# Patient Record
Sex: Male | Born: 1985 | Race: White | Hispanic: No | Marital: Married | Smoking: Never smoker
Health system: Southern US, Community
[De-identification: ages and names within clinical notes are randomized; demographics above are authoritative.]

---

## 2015-12-16 ENCOUNTER — Encounter (HOSPITAL_COMMUNITY): Payer: Self-pay

## 2015-12-16 ENCOUNTER — Emergency Department (HOSPITAL_COMMUNITY)
Admission: EM | Admit: 2015-12-16 | Discharge: 2015-12-16 | Disposition: A | Discharge status: Home / Self Care | Payer: BC Managed Care – PPO | Attending: Emergency Medicine | Admitting: Emergency Medicine

## 2015-12-16 DIAGNOSIS — S0993XA Unspecified injury of face, initial encounter: Secondary | ICD-10-CM | POA: Diagnosis present

## 2015-12-16 DIAGNOSIS — Y999 Unspecified external cause status: Secondary | ICD-10-CM | POA: Insufficient documentation

## 2015-12-16 DIAGNOSIS — S01511A Laceration without foreign body of lip, initial encounter: Secondary | ICD-10-CM | POA: Diagnosis not present

## 2015-12-16 DIAGNOSIS — Z23 Encounter for immunization: Secondary | ICD-10-CM | POA: Insufficient documentation

## 2015-12-16 DIAGNOSIS — Y929 Unspecified place or not applicable: Secondary | ICD-10-CM | POA: Diagnosis not present

## 2015-12-16 DIAGNOSIS — W228XXA Striking against or struck by other objects, initial encounter: Secondary | ICD-10-CM | POA: Diagnosis not present

## 2015-12-16 DIAGNOSIS — Y939 Activity, unspecified: Secondary | ICD-10-CM | POA: Insufficient documentation

## 2015-12-16 MED ORDER — BUPIVACAINE HCL 0.25 % IJ SOLN
5.0000 mL | Freq: Once | INTRAMUSCULAR | Status: AC
  Administered 2015-12-16: 5 mL
  Filled 2015-12-16: qty 5

## 2015-12-16 MED ORDER — TETANUS-DIPHTH-ACELL PERTUSSIS 5-2.5-18.5 LF-MCG/0.5 IM SUSP
0.5000 mL | Freq: Once | INTRAMUSCULAR | Status: AC
  Administered 2015-12-16: 0.5 mL via INTRAMUSCULAR
  Filled 2015-12-16: qty 0.5
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
## 2015-12-16 NOTE — Discharge Instructions (Signed)
Watch for any signs of infection. Apply ice pack for swelling. Bacitracin twice a day. Follow up as needed. Sutures should dissolve on their own in about 7-10 days, if are not resolved, wiped with warm water wash cloth.

## 2016-01-04 ENCOUNTER — Emergency Department (HOSPITAL_COMMUNITY)
Admission: EM | Admit: 2016-01-04 | Discharge: 2016-01-05 | Disposition: A | Discharge status: Home / Self Care | Payer: BC Managed Care – PPO | Attending: Emergency Medicine | Admitting: Emergency Medicine

## 2016-01-04 ENCOUNTER — Encounter (HOSPITAL_COMMUNITY): Payer: Self-pay | Admitting: Emergency Medicine

## 2016-01-04 DIAGNOSIS — H9313 Tinnitus, bilateral: Secondary | ICD-10-CM | POA: Insufficient documentation

## 2016-01-04 DIAGNOSIS — R030 Elevated blood-pressure reading, without diagnosis of hypertension: Secondary | ICD-10-CM | POA: Diagnosis not present

## 2016-01-04 DIAGNOSIS — IMO0001 Reserved for inherently not codable concepts without codable children: Secondary | ICD-10-CM

## 2016-01-04 DIAGNOSIS — Z79899 Other long term (current) drug therapy: Secondary | ICD-10-CM | POA: Insufficient documentation

## 2016-01-04 DIAGNOSIS — R42 Dizziness and giddiness: Secondary | ICD-10-CM | POA: Diagnosis present

## 2016-01-04 NOTE — ED Triage Notes (Signed)
Pt states he was watching tv, got up to go to the bathroom and had "ringing" in his ears.  Didn't feel like he could speak, confused, felt dizzy.  On the way here in the ambulance he states he his vision in his left eye was "blurry with floaters".  History of panic attacks but does not feel that this episode is the same as those he's previously had.

## 2016-01-04 NOTE — ED Triage Notes (Signed)
Pt transported from home by EMS with c/o dizziness and lightheadedness while ambulating to BR. 200/134 initial manual pressure.  A & O

## 2016-01-05 LAB — I-STAT CHEM 8, ED
BUN: 19 mg/dL (ref 6–20)
CALCIUM ION: 1.16 mmol/L (ref 1.15–1.40)
CHLORIDE: 106 mmol/L (ref 101–111)
Creatinine, Ser: 1.1 mg/dL (ref 0.61–1.24)
GLUCOSE: 118 mg/dL — AB (ref 65–99)
HCT: 44 % (ref 39.0–52.0)
Hemoglobin: 15 g/dL (ref 13.0–17.0)
Potassium: 4.6 mmol/L (ref 3.5–5.1)
Sodium: 141 mmol/L (ref 135–145)
TCO2: 25 mmol/L (ref 0–100)

## 2016-01-05 LAB — I-STAT TROPONIN, ED: Troponin i, poc: 0 ng/mL (ref 0.00–0.08)

## 2016-01-05 NOTE — ED Provider Notes (Signed)
[facility]-EMERGENCY DEPT Provider Note   CSN: [id number] Arrival date & time: 01/04/16  2159  By signing my name below, I, Cody Roberson, attest that this documentation has been prepared under the direction and in the presence of  [employer], [place]. Electronically Signed: Christy Roberson, ED Scribe. 01/05/16. 12:55 AM.  History   Chief Complaint Chief Complaint  Patient presents with  . Hypertension   The history is provided by the patient and medical records. No language interpreter was used.   HPI Comments:  Cody Roberson is a 30 y.o. male brought in by ambulance, who presents to the Emergency Department complaining of gradually worsening dizziness and lightheadedness beginning this afternoon. Pt was watching TV and got up to go to the bathroom when he began experiencing tinnitis and felt like his body "gave out." For 15 minutes in the ambulance and a few minutes in the ED, pt began having light sensitivity and seeing flashing lights out of the lower left corner of the left eye. He has a history of anxiety and notes this feels different from his typical anxiety attacks. He denies history of diabetes and taking medications for HTN. Pt notes that he an EKG done over the summer and had sinus tachycardia; as a result, had an entire cardiac panel done which was normal. He denies fevers, recent illness or URI, LOC, chest tightness, hyperventilating, nausea and vomitting.    There are no active problems to display for this patient.   History reviewed. No pertinent surgical history.    Home Medications    Prior to Admission medications   Medication Sig Start Date End Date Taking? Authorizing Provider  naproxen sodium (ANAPROX) 220 MG tablet Take 660 mg by mouth 2 (two) times daily as needed. For gout flare-ups   Yes Historical Provider, MD    Family History No family history on file.  Social History Social History  Substance Use Topics  . Smoking status: Never Smoker  .  Smokeless tobacco: Never Used  . Alcohol use Yes     Comment: socially     Allergies   Review of patient's allergies indicates no known allergies.   Review of Systems Review of Systems 10 systems reviewed and all are negative for acute change except as noted in the HPI.   Physical Exam Updated Vital Signs BP 127/95   Pulse 83   Temp 97.9 F (36.6 C) (Oral)   Resp 17   Ht 5\' 9"  (1.753 m)   Wt 124.7 kg   SpO2 98%   BMI 40.61 kg/m   Physical Exam  Constitutional: He is oriented to person, place, and time. He appears well-developed and well-nourished. No distress.  Nontoxic appearing and in no distress  HENT:  Head: Normocephalic and atraumatic.  Eyes: Conjunctivae and EOM are normal. No scleral icterus.  EOMs normal without nystagmus  Neck: Normal range of motion.  No JVD  Cardiovascular: Normal rate, regular rhythm and intact distal pulses.   Pulmonary/Chest: Effort normal. No respiratory distress. He has no wheezes. He has no rales.  Respirations even and unlabored  Musculoskeletal: Normal range of motion.  Neurological: He is alert and oriented to person, place, and time. No cranial nerve deficit. He exhibits normal muscle tone. Coordination normal.  GCS 15. Speech is goal oriented. No focal neurologic deficits appreciated. Patient ambulatory with steady gait.  Skin: Skin is warm and dry. No rash noted. He is not diaphoretic. No erythema. No pallor.  Psychiatric: He has a normal mood and affect.  His behavior is normal.  Nursing note and vitals reviewed.    ED Treatments / Results   DIAGNOSTIC STUDIES:  Oxygen Saturation is 100% on RA, NML by my interpretation.    COORDINATION OF CARE:  12:55 AM  Discussed treatment plan with pt at bedside and pt agreed to plan.  2:31 AM BP in room is 120's/80's without intervention. Patient denies any symptoms recurrence. ABCD2 score is 2 for symptom duration and blood pressure. Plan for outpatient management. Patient  agreeable to plan.  Labs (all labs ordered are listed, but only abnormal results are displayed) Labs Reviewed  I-STAT CHEM 8, ED - Abnormal; Notable for the following:       Result Value   Glucose, Bld 118 (*)    All other components within normal limits  I-STAT TROPOININ, ED    EKG  EKG Interpretation  Date/Time:  Friday January 04 2016 22:06:12 EDT Ventricular Rate:  124 PR Interval:    QRS Duration: 81 QT Interval:  331 QTC Calculation: 476 R Axis:   1 Text Interpretation:  Sinus tachycardia Minimal ST elevation, inferior leads Borderline prolonged QT interval No old tracing to compare Confirmed by Queens Blvd Endoscopy LLCGLICK  MD, DAVID ([id number]) on 01/05/2016 12:19:54 AM Also confirmed by Preston FleetingGLICK  MD, DAVID ([id number]), editor Whitney PostLOGAN, Cala BradfordKIMBERLY [phone])  on 01/05/2016 12:29:00 PM       Radiology No results found.  Procedures Procedures (including critical care time)  Medications Ordered in ED Medications - No data to display   Initial Impression / Assessment and Plan / ED Course  I have reviewed the triage vital signs and the nursing notes.  Pertinent labs & imaging results that were available during my care of the patient were reviewed by me and considered in my medical decision making (see chart for details).  Clinical Course    30 year old male presents to the emergency department for evaluation of an episode of hypertension. He has no history of this and has never been on any antihypertensive medications. Symptoms resolved while in the emergency department. Blood pressure has normalized without intervention. Patient with reassuring laboratory workup and EKG. Per patient, he has had extensive workup by cardiologist including a negative stress test, Holter monitor, and echocardiogram. Low suspicion for cardiac etiology. It is possible that patient's symptoms may be secondary to TIA. He has no risk factors for stroke and a low ABCD2 score. For this reason, I believe he can be managed on an  outpatient basis. Return precautions discussed and provided. Patient discharged in satisfactory condition with no unaddressed concerns.   Final Clinical Impressions(s) / ED Diagnoses   Final diagnoses:  Elevated blood pressure  Tinnitus, bilateral    New Prescriptions Discharge Medication List as of 01/05/2016  2:31 AM      I personally performed the services described in this documentation, which was scribed in my presence. The recorded information has been reviewed and is accurate.       Antony MaduraKelly Damarea Merkel, PA-C 01/05/16 2047    Dione Boozeavid Glick, MD 01/05/16 2250

## 2016-01-05 NOTE — Discharge Instructions (Signed)
Your workup today is reassuring and your blood pressure has improved without medications. For this reason, we believe that you are stable for discharge. Follow up with your primary care doctor. We also recommend that you follow up with a neurologist. Return to the ED for any new or concerning symptoms.

## 2019-07-08 ENCOUNTER — Ambulatory Visit: Payer: BC Managed Care – PPO | Attending: Internal Medicine

## 2019-07-08 DIAGNOSIS — Z23 Encounter for immunization: Secondary | ICD-10-CM

## 2019-07-08 NOTE — Progress Notes (Signed)
   Covid-19 Vaccination Clinic  Name:  Cody Roberson    MRN: [id number] DOB: 1985/06/26  07/08/2019  Cody Roberson was observed post Covid-19 immunization for 15 minutes without incident. He was provided with Vaccine Information Sheet and instruction to access the V-Safe system.   Cody Roberson was instructed to call 911 with any severe reactions post vaccine: Marland Kitchen Difficulty breathing  . Swelling of face and throat  . A fast heartbeat  . A bad rash all over body  . Dizziness and weakness   Immunizations Administered    Name Date Dose VIS Date Route   Pfizer COVID-19 Vaccine 07/08/2019  4:29 PM 0.3 mL 04/01/2019 Intramuscular   Manufacturer: [employer], [employer]   Lot: [id number]   NDC: [address]-2

## 2019-08-03 ENCOUNTER — Ambulatory Visit: Payer: BC Managed Care – PPO | Attending: Internal Medicine

## 2019-08-03 DIAGNOSIS — Z23 Encounter for immunization: Secondary | ICD-10-CM

## 2019-08-03 NOTE — Progress Notes (Signed)
   Covid-19 Vaccination Clinic  Name:  Cody Roberson    MRN: [id number] DOB: 08/31/1985  08/03/2019  Cody Roberson was observed post Covid-19 immunization for 15 minutes without incident. He was provided with Vaccine Information Sheet and instruction to access the V-Safe system.   Cody Roberson was instructed to call 911 with any severe reactions post vaccine: Marland Kitchen Difficulty breathing  . Swelling of face and throat  . A fast heartbeat  . A bad rash all over body  . Dizziness and weakness   Immunizations Administered    Name Date Dose VIS Date Route   Pfizer COVID-19 Vaccine 08/03/2019  3:27 PM 0.3 mL 04/01/2019 Intramuscular   Manufacturer: [employer], [employer]   Lot: [id number]   NDC: [address]-7

## 2020-05-26 ENCOUNTER — Encounter (HOSPITAL_COMMUNITY): Payer: Self-pay | Admitting: Emergency Medicine

## 2020-05-26 ENCOUNTER — Other Ambulatory Visit: Payer: Self-pay

## 2020-05-26 ENCOUNTER — Emergency Department (HOSPITAL_COMMUNITY)
Admission: EM | Admit: 2020-05-26 | Discharge: 2020-05-26 | Disposition: A | Discharge status: Home / Self Care | Payer: BC Managed Care – PPO | Attending: Emergency Medicine | Admitting: Emergency Medicine

## 2020-05-26 ENCOUNTER — Emergency Department (HOSPITAL_COMMUNITY): Payer: BC Managed Care – PPO

## 2020-05-26 DIAGNOSIS — N453 Epididymo-orchitis: Secondary | ICD-10-CM | POA: Insufficient documentation

## 2020-05-26 DIAGNOSIS — N5089 Other specified disorders of the male genital organs: Secondary | ICD-10-CM

## 2020-05-26 DIAGNOSIS — N50812 Left testicular pain: Secondary | ICD-10-CM | POA: Diagnosis present

## 2020-05-26 LAB — URINALYSIS, ROUTINE W REFLEX MICROSCOPIC
Bilirubin Urine: NEGATIVE
Glucose, UA: NEGATIVE mg/dL
Hgb urine dipstick: NEGATIVE
Ketones, ur: NEGATIVE mg/dL
Leukocytes,Ua: NEGATIVE
Nitrite: NEGATIVE
Protein, ur: NEGATIVE mg/dL
Specific Gravity, Urine: 1.02 (ref 1.005–1.030)
pH: 5 (ref 5.0–8.0)

## 2020-05-26 LAB — CBC WITH DIFFERENTIAL/PLATELET
Abs Immature Granulocytes: 0.04 10*3/uL (ref 0.00–0.07)
Basophils Absolute: 0.1 10*3/uL (ref 0.0–0.1)
Basophils Relative: 1 %
Eosinophils Absolute: 0.1 10*3/uL (ref 0.0–0.5)
Eosinophils Relative: 1 %
HCT: 43.6 % (ref 39.0–52.0)
Hemoglobin: 13.8 g/dL (ref 13.0–17.0)
Immature Granulocytes: 0 %
Lymphocytes Relative: 14 %
Lymphs Abs: 1.5 10*3/uL (ref 0.7–4.0)
MCH: 28.3 pg (ref 26.0–34.0)
MCHC: 31.7 g/dL (ref 30.0–36.0)
MCV: 89.5 fL (ref 80.0–100.0)
Monocytes Absolute: 0.6 10*3/uL (ref 0.1–1.0)
Monocytes Relative: 5 %
Neutro Abs: 8.4 10*3/uL — ABNORMAL HIGH (ref 1.7–7.7)
Neutrophils Relative %: 79 %
Platelets: 403 10*3/uL — ABNORMAL HIGH (ref 150–400)
RBC: 4.87 MIL/uL (ref 4.22–5.81)
RDW: 12.6 % (ref 11.5–15.5)
WBC: 10.7 10*3/uL — ABNORMAL HIGH (ref 4.0–10.5)
nRBC: 0 % (ref 0.0–0.2)

## 2020-05-26 LAB — BASIC METABOLIC PANEL
Anion gap: 11 (ref 5–15)
BUN: 14 mg/dL (ref 6–20)
CO2: 24 mmol/L (ref 22–32)
Calcium: 9.6 mg/dL (ref 8.9–10.3)
Chloride: 107 mmol/L (ref 98–111)
Creatinine, Ser: 1.05 mg/dL (ref 0.61–1.24)
GFR, Estimated: >60 mL/min (ref >60)
Glucose, Bld: 95 mg/dL (ref 70–99)
Potassium: 4.1 mmol/L (ref 3.5–5.1)
Sodium: 142 mmol/L (ref 135–145)

## 2020-05-26 MED ORDER — LEVOFLOXACIN IN D5W 500 MG/100ML IV SOLN
500.0000 mg | Freq: Once | INTRAVENOUS | Status: DC
  Filled 2020-05-26: qty 100

## 2020-05-26 MED ORDER — NAPROXEN 500 MG PO TABS: 500.0000 mg | ORAL_TABLET | Freq: Two times a day (BID) | ORAL | 0 refills | Status: AC

## 2020-05-26 MED ORDER — LEVOFLOXACIN 500 MG PO TABS: 500.0000 mg | ORAL_TABLET | Freq: Every day | ORAL | 0 refills | Status: AC

## 2020-05-26 MED ORDER — LEVOFLOXACIN 500 MG PO TABS
500.0000 mg | ORAL_TABLET | Freq: Once | ORAL | Status: AC
  Administered 2020-05-26: 500 mg via ORAL
  Filled 2020-05-26: qty 1

## 2020-05-26 NOTE — ED Triage Notes (Addendum)
Patient reports swelling to left testicle x4 days. Reports seen at [facility] for testicle pain and given antibiotics x 1 week ago.

## 2020-05-26 NOTE — ED Provider Notes (Signed)
[facility] EMERGENCY DEPARTMENT Provider Note  CSN: [id number] Arrival date & time: 05/26/20 1729    History Chief Complaint  Patient presents with  . Groin Swelling    HPI  Cody Roberson is a 35 y.o. male with no significant PMH reports he was having some dysuria about a week ago. He was seen at [facility] and had UA done showing some protein, started on doxycycline which he has completed. GC/CT tests neg per patient. He reports the dysuria has resolved but in the last 4 days he has had marked swelling of the left testicle, associated with mild-moderate aching pain, worse when standing. No fever. No injury. No N/V/D.   History reviewed. No pertinent past medical history.  History reviewed. No pertinent surgical history.  No family history on file.  Social History   Tobacco Use  . Smoking status: Never Smoker  . Smokeless tobacco: Never Used  Substance Use Topics  . Alcohol use: Yes    Comment: socially     Home Medications Prior to Admission medications   Medication Sig Start Date End Date Taking? Authorizing Provider  naproxen sodium (ANAPROX) 220 MG tablet Take 660 mg by mouth 2 (two) times daily as needed. For gout flare-ups    [provider]     Allergies    Patient has no known allergies.   Review of Systems   Review of Systems A comprehensive review of systems was completed and negative except as noted in HPI.    Physical Exam BP (!) 159/97 (BP Location: Left Arm)   Pulse (!) 110   Temp 98 F (36.7 C) (Oral)   Resp 16   SpO2 99%   Physical Exam Vitals and nursing note reviewed.  Constitutional:      Appearance: Normal appearance.  HENT:     Head: Normocephalic and atraumatic.     Nose: Nose normal.     Mouth/Throat:     Mouth: Mucous membranes are moist.  Eyes:     Extraocular Movements: Extraocular movements intact.     Conjunctiva/sclera: Conjunctivae normal.  Cardiovascular:     Rate and Rhythm: Normal rate.   Pulmonary:     Effort: Pulmonary effort is normal.     Breath sounds: Normal breath sounds.  Abdominal:     General: Abdomen is flat.     Palpations: Abdomen is soft.     Tenderness: There is no abdominal tenderness.  Genitourinary:    Comments: No penile discharge, L testicle is markedly swollen and mildly tender to palpation, R testicle is normal. Musculoskeletal:        General: No swelling. Normal range of motion.     Cervical back: Neck supple.  Skin:    General: Skin is warm and dry.  Neurological:     General: No focal deficit present.     Mental Status: He is alert.  Psychiatric:        Mood and Affect: Mood normal.      ED Results / Procedures / Treatments   Labs (all labs ordered are listed, but only abnormal results are displayed) Labs Reviewed  CBC WITH DIFFERENTIAL/PLATELET - Abnormal; Notable for the following components:      Result Value   WBC 10.7 (*)    Platelets 403 (*)    Neutro Abs 8.4 (*)    All other components within normal limits  URINALYSIS, ROUTINE W REFLEX MICROSCOPIC  BASIC METABOLIC PANEL    EKG None  Radiology No results found.  Procedures Procedures  Medications Ordered in the ED Medications - No data to display   MDM Rules/Calculators/A&P MDM Patient with scrotal and testicular swelling, will check labs and send for [place]. Doubt torsion, likely epididymitis/orchitis.   ED Course  I have reviewed the triage vital signs and the nursing notes.  Pertinent labs & imaging results that were available during my care of the patient were reviewed by me and considered in my medical decision making (see chart for details).  Clinical Course as of 05/26/20 2045  Sat May 26, 2020  2005 CBC with mild leukocytosis.  [CS]  2021 BMP is normal.  [CS]  2024 UA neg, [place] consistent with epididymo-orchitis, will Rx Levaquin, NSAIDs, recommend scrotal elevation, Urology follow up.  [CS]    Clinical Course User Index [CS] Pollyann Savoy, MD     Final Clinical Impression(s) / ED Diagnoses Final diagnoses:  Epididymoorchitis    Rx / DC Orders ED Discharge Orders    None       Pollyann Savoy, MD 05/26/20 2045

## 2020-06-15 ENCOUNTER — Other Ambulatory Visit: Payer: Self-pay | Admitting: Urology

## 2020-06-15 ENCOUNTER — Other Ambulatory Visit (HOSPITAL_COMMUNITY): Payer: Self-pay | Admitting: Urology

## 2020-06-15 DIAGNOSIS — N453 Epididymo-orchitis: Secondary | ICD-10-CM

## 2020-06-15 DIAGNOSIS — R93812 Abnormal radiologic findings on diagnostic imaging of left testicle: Secondary | ICD-10-CM

## 2020-06-20 ENCOUNTER — Other Ambulatory Visit: Payer: Self-pay

## 2020-06-20 ENCOUNTER — Ambulatory Visit (HOSPITAL_COMMUNITY)
Admission: RE | Admit: 2020-06-20 | Discharge: 2020-06-20 | Disposition: A | Discharge status: Home / Self Care | Payer: BC Managed Care – PPO | Source: Ambulatory Visit | Attending: Urology | Admitting: Urology

## 2020-06-20 DIAGNOSIS — N453 Epididymo-orchitis: Secondary | ICD-10-CM | POA: Insufficient documentation

## 2020-06-20 DIAGNOSIS — R93812 Abnormal radiologic findings on diagnostic imaging of left testicle: Secondary | ICD-10-CM | POA: Diagnosis present

## 2020-07-02 ENCOUNTER — Other Ambulatory Visit: Payer: Self-pay | Admitting: Urology

## 2020-07-02 DIAGNOSIS — N453 Epididymo-orchitis: Secondary | ICD-10-CM

## 2020-07-02 DIAGNOSIS — N433 Hydrocele, unspecified: Secondary | ICD-10-CM

## 2020-08-13 ENCOUNTER — Other Ambulatory Visit: Payer: Self-pay

## 2020-08-13 ENCOUNTER — Ambulatory Visit
Admission: RE | Admit: 2020-08-13 | Discharge: 2020-08-13 | Disposition: A | Discharge status: Home / Self Care | Payer: BC Managed Care – PPO | Source: Ambulatory Visit | Attending: Urology | Admitting: Urology

## 2020-08-13 DIAGNOSIS — N453 Epididymo-orchitis: Secondary | ICD-10-CM | POA: Diagnosis not present

## 2020-08-13 DIAGNOSIS — N433 Hydrocele, unspecified: Secondary | ICD-10-CM | POA: Insufficient documentation

## 2021-03-18 NOTE — Addendum Note (Signed)
Encounter addended by: Novella Olive on: 03/18/2021 9:59 AM  Actions taken: Letter saved

## 2021-08-10 IMAGING — US US SCROTUM W/ DOPPLER COMPLETE
1 series · 13 of 25 positions shown · non-contrast
Comparison: 05/26/2020

CLINICAL DATA: Abnormal findings the left testicle

EXAM:
SCROTAL ULTRASOUND
DOPPLER ULTRASOUND OF THE TESTICLES
TECHNIQUE: Complete ultrasound examination of the testicles, epididymis, and
other scrotal structures was performed. Color and spectral Doppler
ultrasound were also utilized to evaluate blood flow to the
testicles.

[Series 1: us scrotum w/ doppler complete · 13 of 59 slices shown]
[im 1/59]
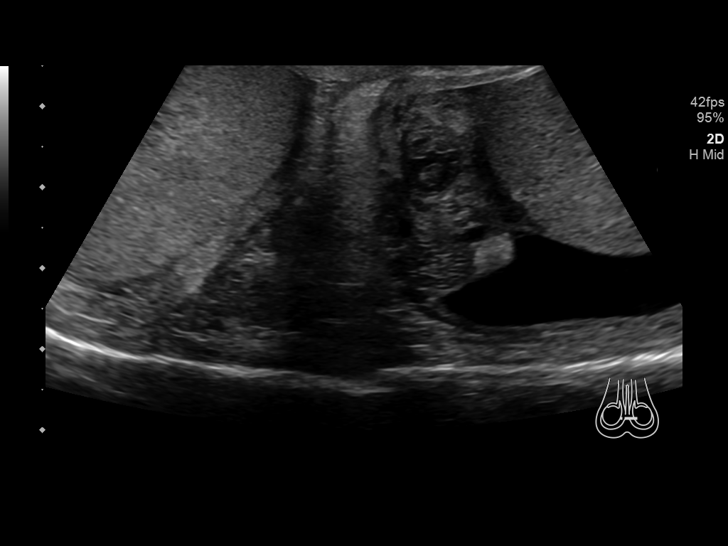
[im 5/59]
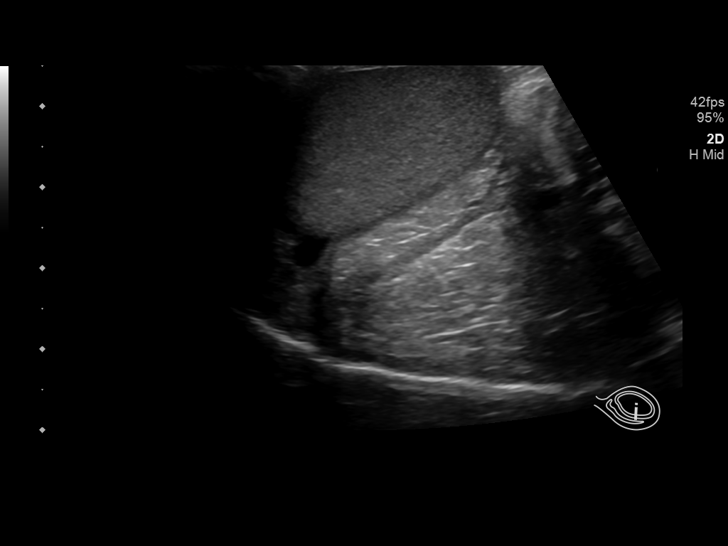
[im 10/59]
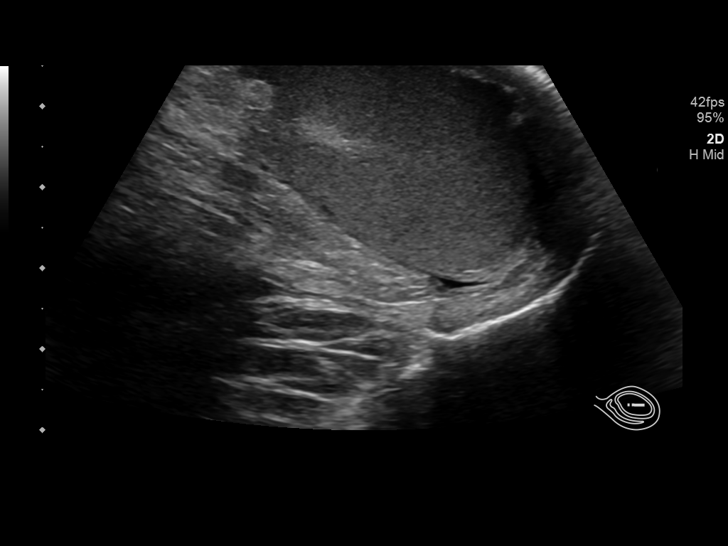
[im 15/59]
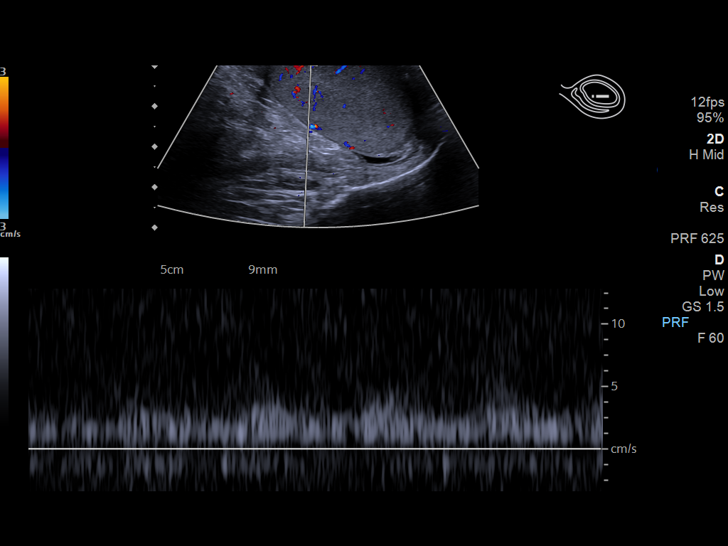
[im 20/59]
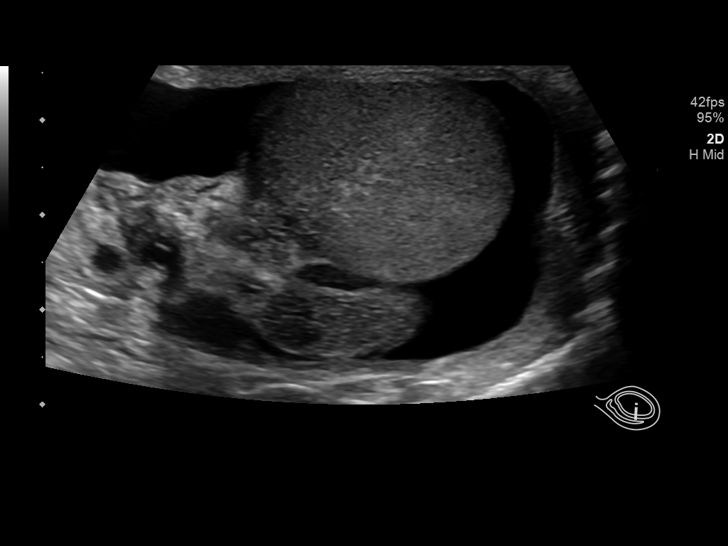
[im 25/59]
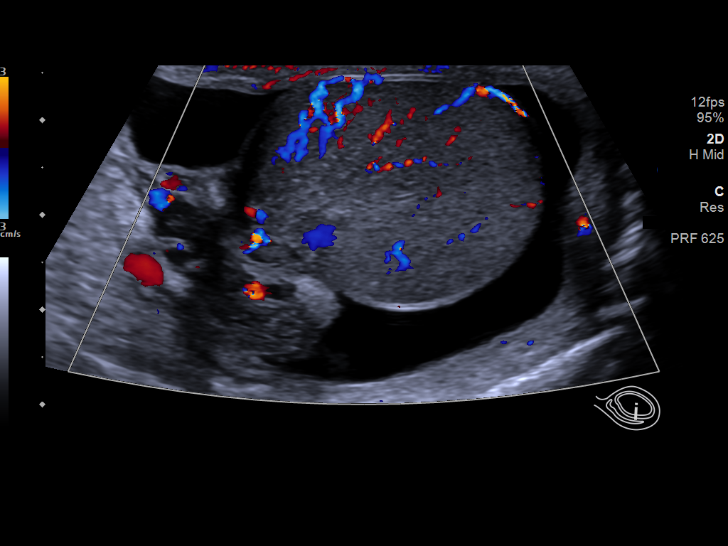
[im 30/59]
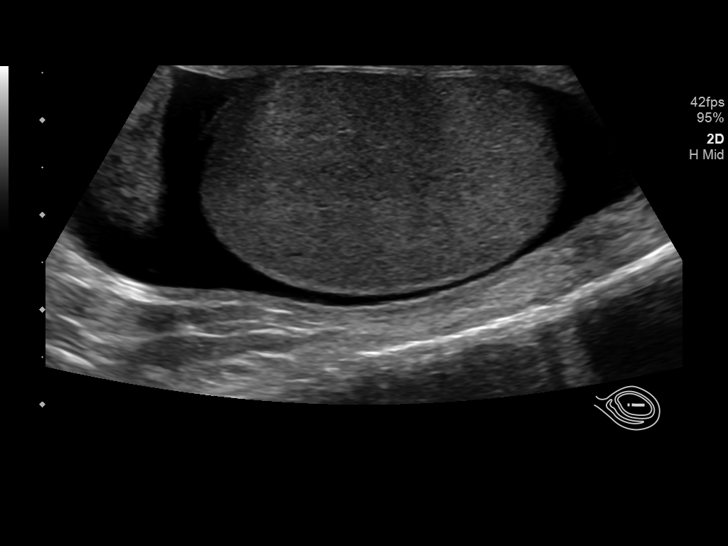
[im 34/59]
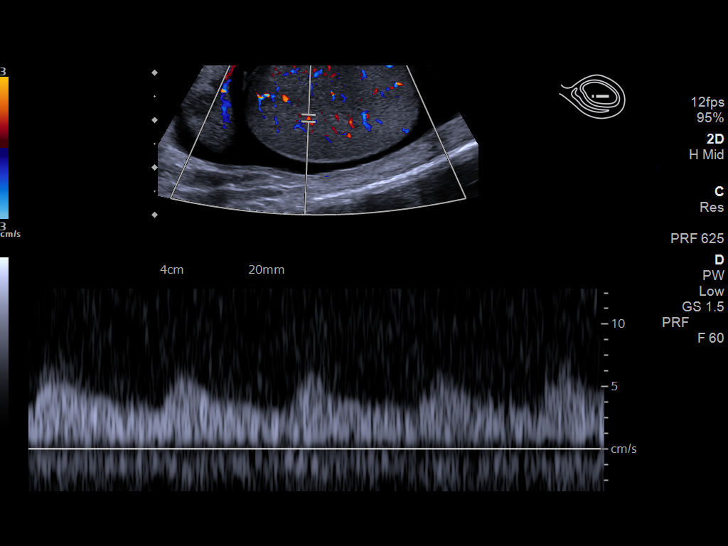
[im 39/59]
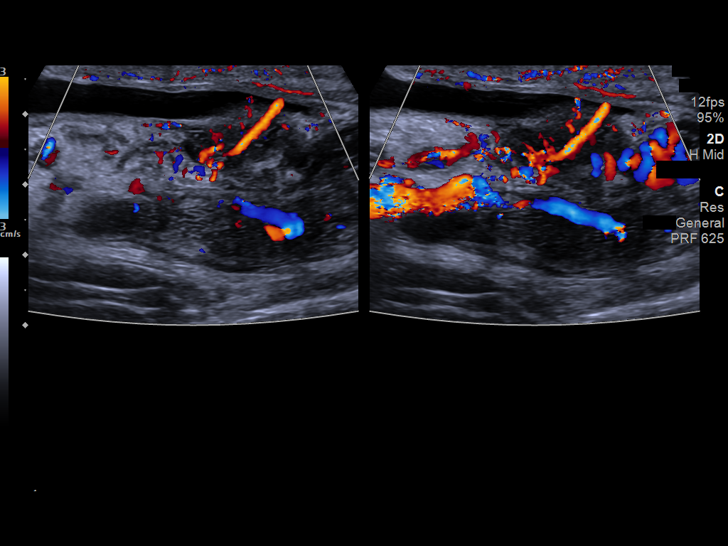
[im 44/59]
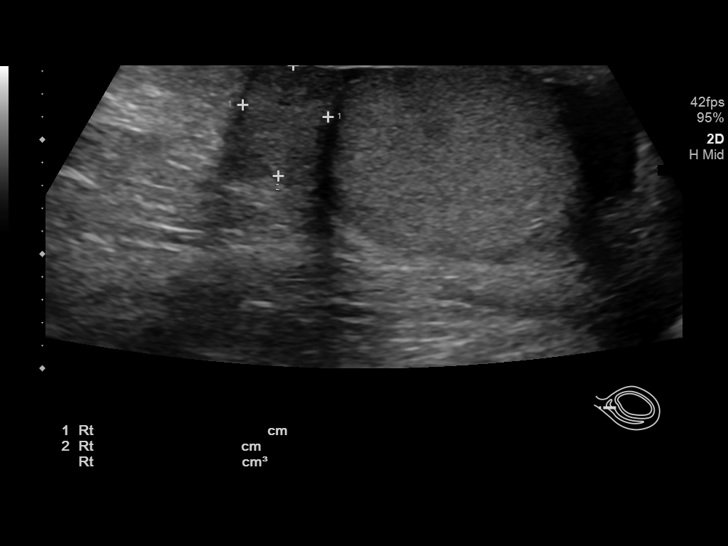
[im 49/59]
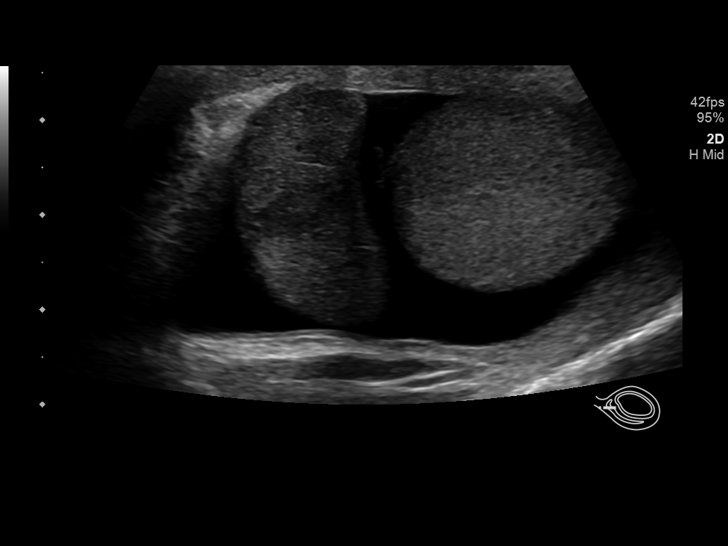
[im 54/59]
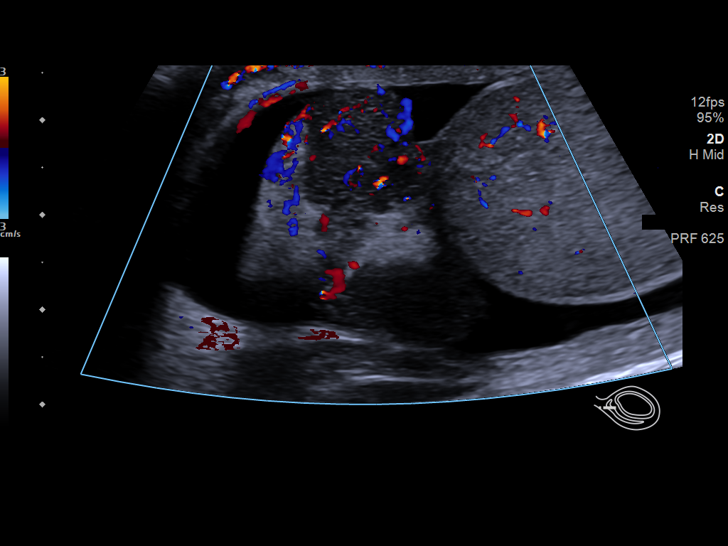
[im 59/59]
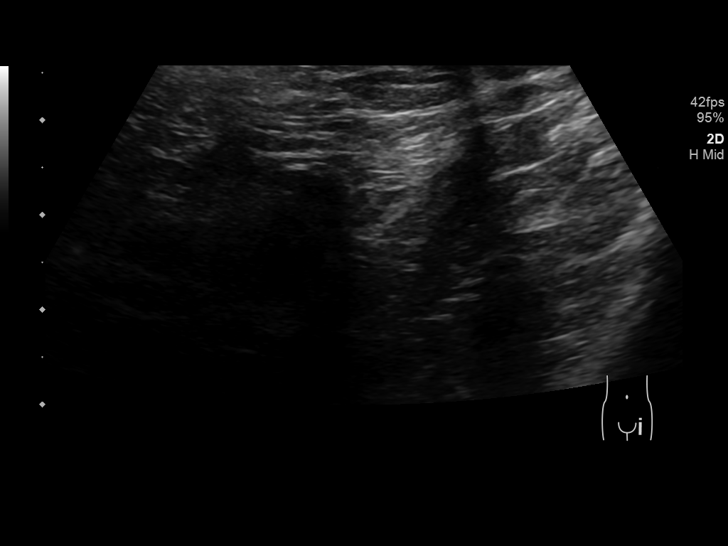

[13 of 25 positions shown; findings below may reference images not displayed]

FINDINGS: Right testicle

Measurements: 4.1 x 2.4 x 3.8 cm. No mass or microlithiasis
visualized.

Left testicle

Measurements: 3.9 x 2.3 x 3.1 cm. The left testicle appears to be
hypervascular.

Right epididymis:  Normal in size and appearance.

Left epididymis: The left epididymis appears to be hypervascular.
The left epididymis is enlarged and heterogeneous.

Hydrocele: There is a persistent left-sided hydrocele, small in size
and decreased from prior study.

Varicocele:  None visualized.

Pulsed Doppler interrogation of both testes demonstrates normal low
resistance arterial and venous waveforms bilaterally.

There is mild scrotal wall thickening, likely reactive.
IMPRESSION: 1. No evidence for testicular torsion.
2. There are persistent findings of left-sided epididymo-orchitis.
3. Small left-sided hydrocele, improved from prior study.

## 2021-10-03 IMAGING — US US SCROTUM W/ DOPPLER COMPLETE
1 series · 13 of 25 positions shown · non-contrast
Comparison: May 26, 2020 and June 20, 2020

CLINICAL DATA: Hydrocele.  Follow-up epididymal orchitis.

EXAM:
SCROTAL ULTRASOUND
DOPPLER ULTRASOUND OF THE TESTICLES
TECHNIQUE: Complete ultrasound examination of the testicles, epididymis, and
other scrotal structures was performed. Color and spectral Doppler
ultrasound were also utilized to evaluate blood flow to the
testicles.

[Series 1: us scrotum w/doppler · 53 acquisitions, 13 frames shown]
[im 1/53]
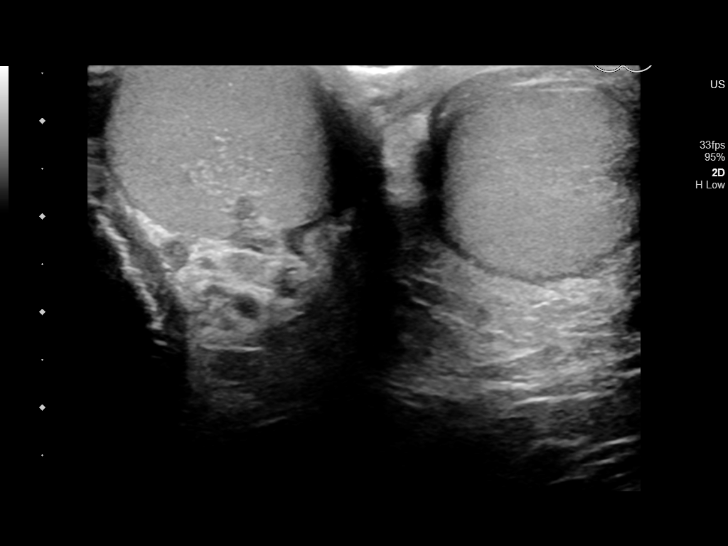
[im 5/53]
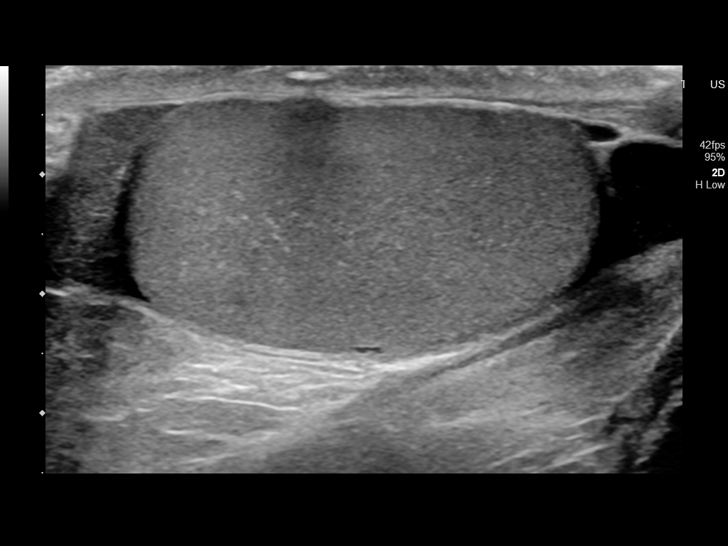
[im 9/53]
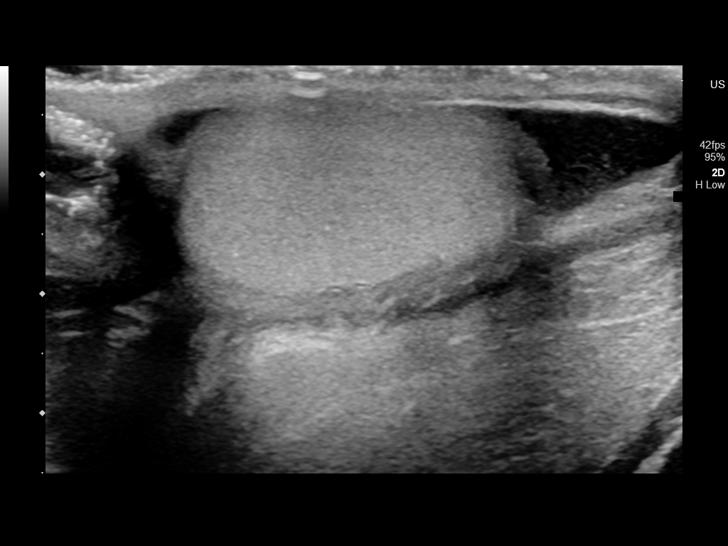
[im 14/53]
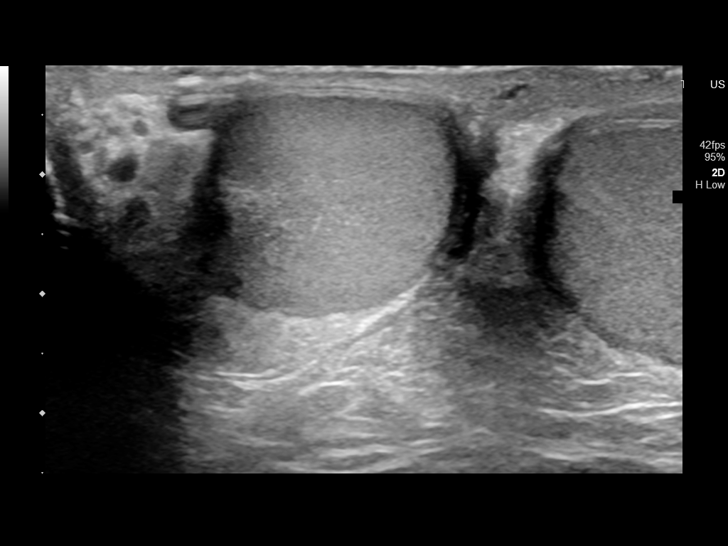
[im 18/53]
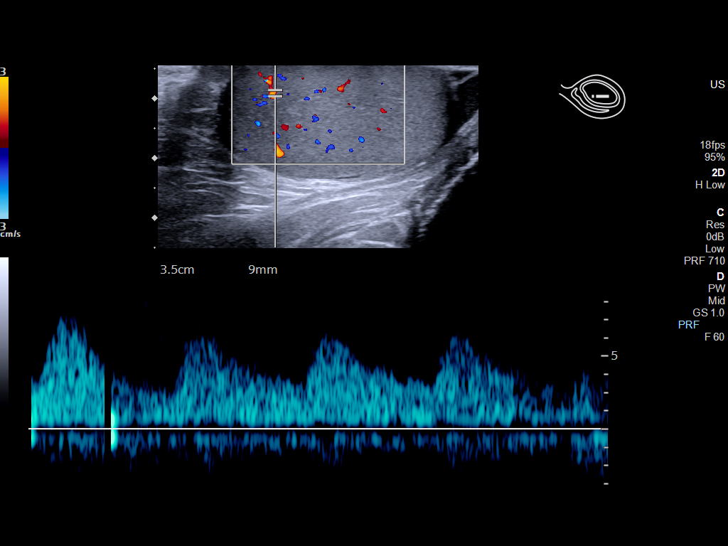
[im 22/53]
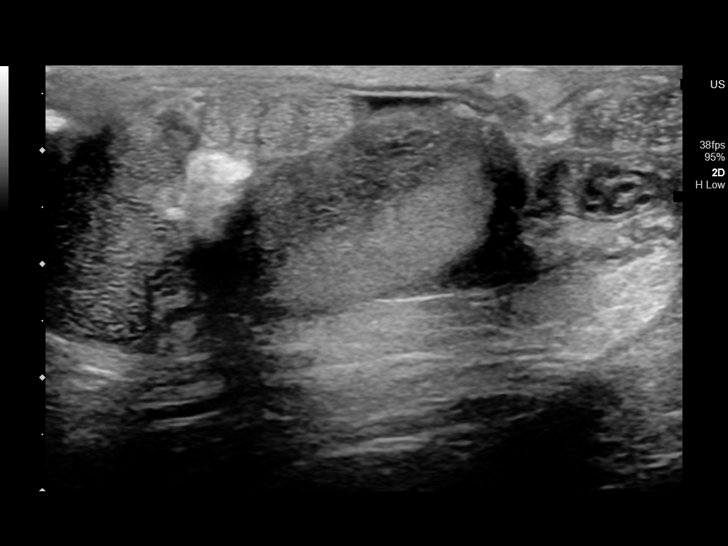
[im 27/53]
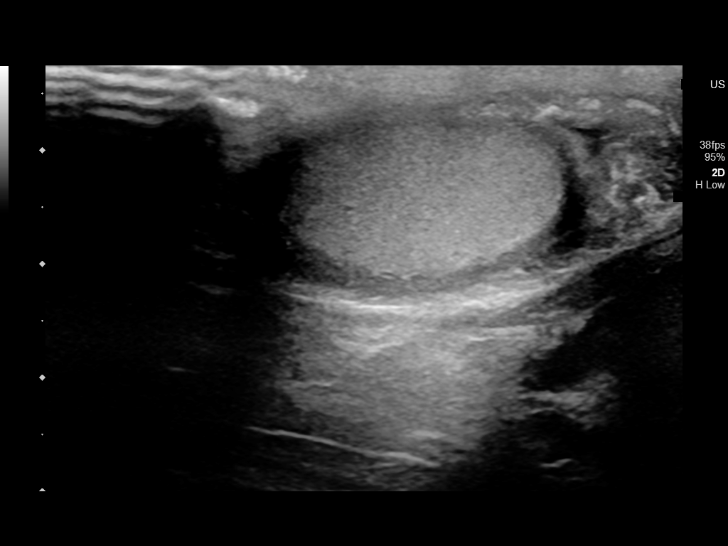
[im 31/53]
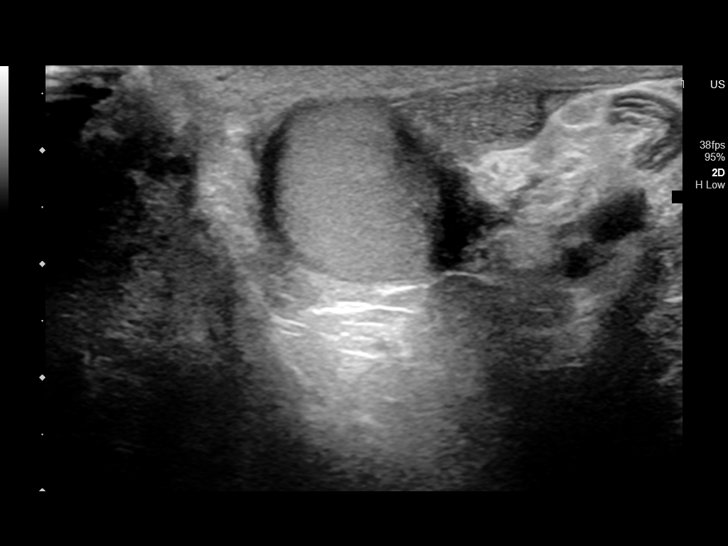
[im 35/53]
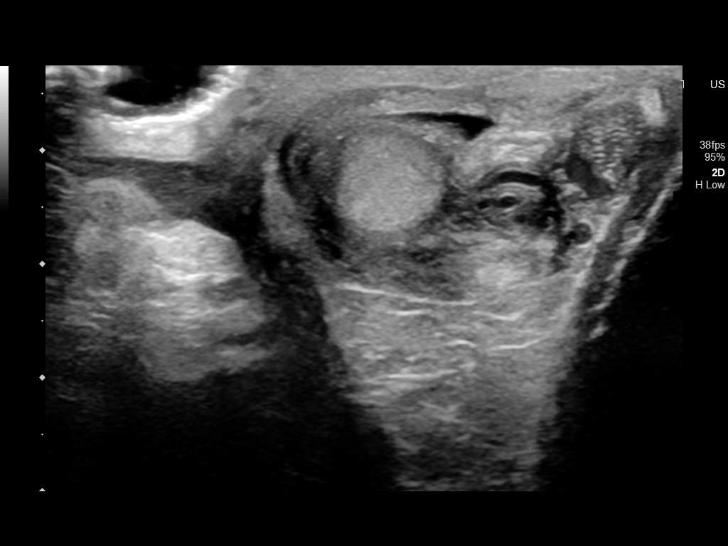
[im 40/53]
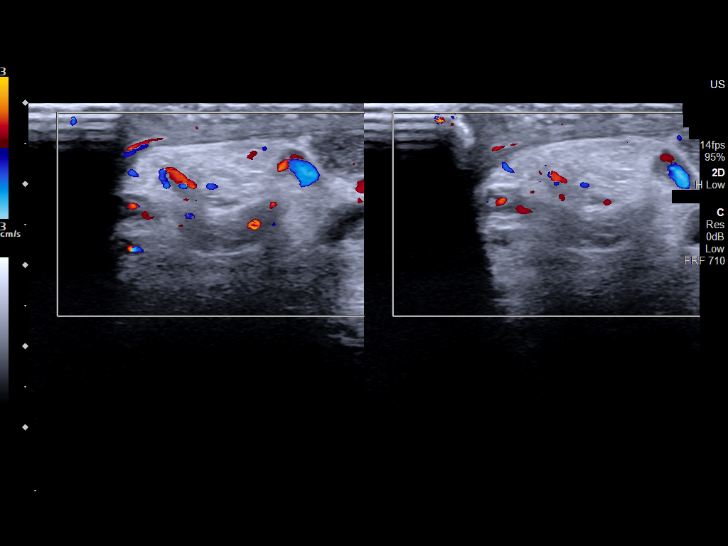
[im 44/53]
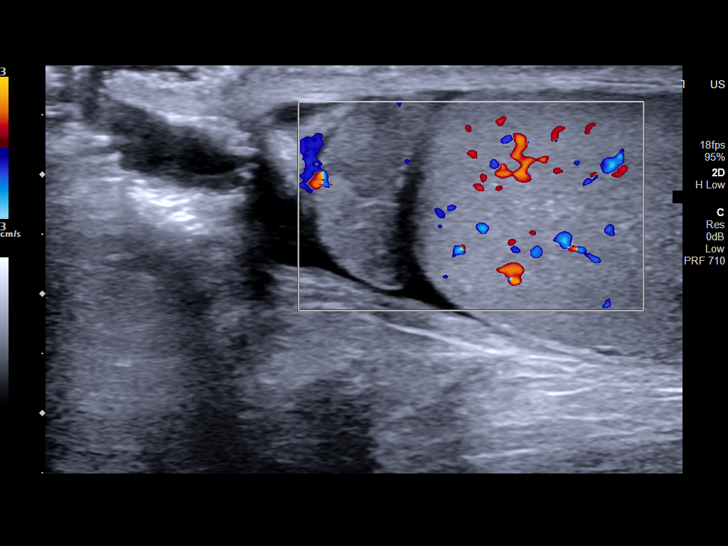
[im 48/53]
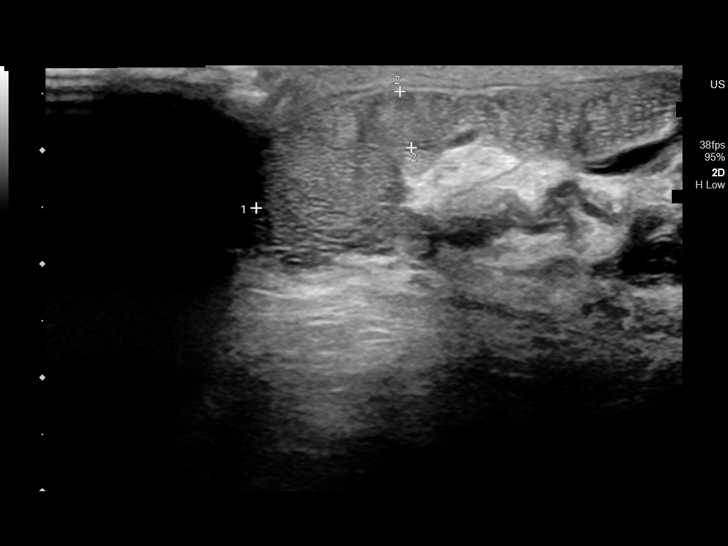
[im 53/53]
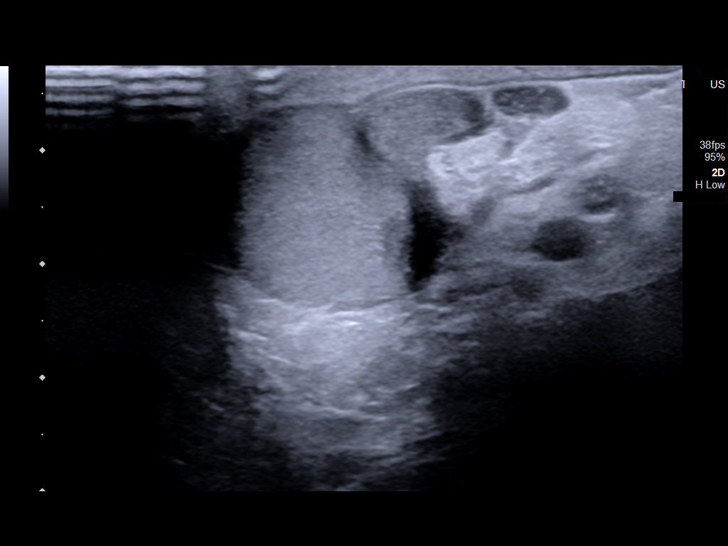

[13 of 25 positions shown; findings below may reference images not displayed]

FINDINGS: Right testicle

Measurements: 4.0 x 1.9 x 2.6 cm. No mass or microlithiasis
visualized.

Left testicle

Measurements: 3.6 x 2.0 x 2.6 cm. No mass or microlithiasis
visualized.

Right epididymis:  Normal in size and appearance.

Left epididymis: The left epididymis remains prominent. However, is
no longer hyperemic. Additionally, is now homogeneous in echotexture
while it was mixed in echotexture previously.

Hydrocele: No significant hydrocele identified. The previously
identified hydrocele has almost completely resolved.

Varicocele:  None visualized.

Pulsed Doppler interrogation of both testes demonstrates normal low
resistance arterial and venous waveforms bilaterally.
IMPRESSION: 1. Continued improvement of the left epididymis. While the left
epididymis remains prominent in size, it is no longer hyperemic and
the mixed echogenicity has reverted to normal homogeneous
echogenicity. Recommend and either follow-up in 3-6 months to ensure
return to normal.
2. Near complete interval resolution of the left hydrocele. Only
minimal fluid remains.
3. No other abnormalities.
